# Patient Record
Sex: Female | Born: 1995 | Hispanic: No | Marital: Married | Smoking: Former smoker
Health system: Western US, Academic
[De-identification: ages and names within clinical notes are randomized; demographics above are authoritative.]

## PROBLEM LIST (undated history)

## (undated) DIAGNOSIS — J45909 Unspecified asthma, uncomplicated: Secondary | ICD-10-CM

## (undated) DIAGNOSIS — N879 Dysplasia of cervix uteri, unspecified: Secondary | ICD-10-CM

## (undated) HISTORY — PX: LIVER SURGERY: SHX698

## (undated) HISTORY — DX: Dysplasia of cervix uteri, unspecified: N87.9

## (undated) HISTORY — DX: Unspecified asthma, uncomplicated: J45.909

---

## 2015-08-09 ENCOUNTER — Emergency Department (HOSPITAL_COMMUNITY): Payer: No Typology Code available for payment source

## 2015-08-09 ENCOUNTER — Emergency Department (HOSPITAL_COMMUNITY)
Admission: EM | Admit: 2015-08-09 | Discharge: 2015-08-09 | Disposition: A | Discharge status: Home / Self Care | Payer: No Typology Code available for payment source | Attending: Emergency Medicine | Admitting: Emergency Medicine

## 2015-08-09 ENCOUNTER — Encounter (HOSPITAL_COMMUNITY): Payer: Self-pay | Admitting: *Deleted

## 2015-08-09 DIAGNOSIS — Y92410 Unspecified street and highway as the place of occurrence of the external cause: Secondary | ICD-10-CM | POA: Insufficient documentation

## 2015-08-09 DIAGNOSIS — I10 Essential (primary) hypertension: Secondary | ICD-10-CM | POA: Diagnosis not present

## 2015-08-09 DIAGNOSIS — Y9389 Activity, other specified: Secondary | ICD-10-CM | POA: Insufficient documentation

## 2015-08-09 DIAGNOSIS — S60512A Abrasion of left hand, initial encounter: Secondary | ICD-10-CM | POA: Insufficient documentation

## 2015-08-09 DIAGNOSIS — Y999 Unspecified external cause status: Secondary | ICD-10-CM | POA: Insufficient documentation

## 2015-08-09 DIAGNOSIS — S6992XA Unspecified injury of left wrist, hand and finger(s), initial encounter: Secondary | ICD-10-CM | POA: Diagnosis present

## 2015-08-09 MED ORDER — CYCLOBENZAPRINE HCL 10 MG PO TABS: 10.0000 mg | ORAL_TABLET | Freq: Two times a day (BID) | ORAL | Status: DC | PRN

## 2015-08-09 MED ORDER — NAPROXEN 375 MG PO TABS: 375.0000 mg | ORAL_TABLET | Freq: Two times a day (BID) | ORAL | Status: DC
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
## 2015-08-09 NOTE — ED Notes (Signed)
PT DISCHARGED. INSTRUCTIONS AND PRESCRIPTIONS GIVEN. AAOX4. PT IN NO APPARENT DISTRESS. THE OPPORTUNITY TO ASK QUESTIONS WAS PROVIDED. 

## 2016-05-25 ENCOUNTER — Emergency Department (HOSPITAL_COMMUNITY): Payer: PRIVATE HEALTH INSURANCE

## 2016-05-25 ENCOUNTER — Encounter (HOSPITAL_COMMUNITY): Payer: Self-pay | Admitting: *Deleted

## 2016-05-25 ENCOUNTER — Emergency Department (HOSPITAL_COMMUNITY)
Admission: EM | Admit: 2016-05-25 | Discharge: 2016-05-26 | Disposition: A | Discharge status: Home / Self Care | Payer: PRIVATE HEALTH INSURANCE | Attending: Emergency Medicine | Admitting: Emergency Medicine

## 2016-05-25 DIAGNOSIS — R1011 Right upper quadrant pain: Secondary | ICD-10-CM

## 2016-05-25 DIAGNOSIS — K7689 Other specified diseases of liver: Secondary | ICD-10-CM | POA: Diagnosis not present

## 2016-05-25 DIAGNOSIS — F17200 Nicotine dependence, unspecified, uncomplicated: Secondary | ICD-10-CM | POA: Diagnosis not present

## 2016-05-25 DIAGNOSIS — Z79899 Other long term (current) drug therapy: Secondary | ICD-10-CM | POA: Insufficient documentation

## 2016-05-25 DIAGNOSIS — R109 Unspecified abdominal pain: Secondary | ICD-10-CM | POA: Diagnosis present

## 2016-05-25 LAB — URINALYSIS, ROUTINE W REFLEX MICROSCOPIC
BILIRUBIN URINE: NEGATIVE
Glucose, UA: NEGATIVE mg/dL
HGB URINE DIPSTICK: NEGATIVE
Ketones, ur: NEGATIVE mg/dL
Leukocytes, UA: NEGATIVE
Nitrite: NEGATIVE
PH: 5 (ref 5.0–8.0)
Protein, ur: NEGATIVE mg/dL
SPECIFIC GRAVITY, URINE: 1.025 (ref 1.005–1.030)

## 2016-05-25 LAB — POC URINE PREG, ED: Preg Test, Ur: NEGATIVE

## 2016-05-25 LAB — COMPREHENSIVE METABOLIC PANEL
ALBUMIN: 3.6 g/dL (ref 3.5–5.0)
ALT: 20 U/L (ref 14–54)
ANION GAP: 6 (ref 5–15)
AST: 24 U/L (ref 15–41)
Alkaline Phosphatase: 74 U/L (ref 38–126)
BUN: 9 mg/dL (ref 6–20)
CHLORIDE: 106 mmol/L (ref 101–111)
CO2: 25 mmol/L (ref 22–32)
Calcium: 8.8 mg/dL — ABNORMAL LOW (ref 8.9–10.3)
Creatinine, Ser: 0.97 mg/dL (ref 0.44–1.00)
GFR calc non Af Amer: >60 mL/min (ref >60)
GLUCOSE: 79 mg/dL (ref 65–99)
POTASSIUM: 3.5 mmol/L (ref 3.5–5.1)
SODIUM: 137 mmol/L (ref 135–145)
Total Bilirubin: 1.4 mg/dL — ABNORMAL HIGH (ref 0.3–1.2)
Total Protein: 7 g/dL (ref 6.5–8.1)

## 2016-05-25 LAB — LIPASE, BLOOD: LIPASE: 25 U/L (ref 11–51)

## 2016-05-25 LAB — CBC
HEMATOCRIT: 41 % (ref 36.0–46.0)
HEMOGLOBIN: 13.8 g/dL (ref 12.0–15.0)
MCH: 30.4 pg (ref 26.0–34.0)
MCHC: 33.7 g/dL (ref 30.0–36.0)
MCV: 90.3 fL (ref 78.0–100.0)
Platelets: 197 10*3/uL (ref 150–400)
RBC: 4.54 MIL/uL (ref 3.87–5.11)
RDW: 13.2 % (ref 11.5–15.5)
WBC: 5.2 10*3/uL (ref 4.0–10.5)

## 2016-05-25 NOTE — ED Triage Notes (Signed)
The pt is c/o rt abd pain since last Thursday no n v or diarrhea  lmp 4-17

## 2016-05-25 NOTE — ED Notes (Signed)
Negative Preg Test...Marland Kitchen

## 2016-05-25 NOTE — ED Provider Notes (Signed)
[facility]-EMERGENCY DEPT Provider Note   CSN: [id number] Arrival date & time: 05/25/16  1816     History   Chief Complaint Chief Complaint  Patient presents with  . Abdominal Pain    HPI Tara Rogers is a 21 y.o. female.  HPI Patient presents with right sided abdomen or lower chest pain. Began 4 days ago. Initially somewhat dull but now more constant and severe. Worse with movements. Worse with breathing. No nausea vomiting or diarrhea. No change with food. Worse with certain positions. No trauma. No fevers or chills. No cough. No shortness of breath or swelling in her legs. No urinary symptoms. History reviewed. No pertinent past medical history.  There are no active problems to display for this patient.   History reviewed. No pertinent surgical history.  OB History    No data available       Home Medications    Prior to Admission medications   Medication Sig Start Date End Date Taking? Authorizing Provider  cyclobenzaprine (FLEXERIL) 10 MG tablet Take 1 tablet (10 mg total) by mouth 2 (two) times daily as needed for muscle spasms. Patient not taking: Reported on 05/25/2016 08/09/15   Arthor CaptainHarris, Abigail, PA-C  naproxen (NAPROSYN) 375 MG tablet Take 1 tablet (375 mg total) by mouth 2 (two) times daily. Patient not taking: Reported on 05/25/2016 08/09/15   Arthor CaptainHarris, Abigail, PA-C    Family History No family history on file.  Social History Social History  Substance Use Topics  . Smoking status: Current Every Day Smoker  . Smokeless tobacco: Never Used  . Alcohol use Yes     Comment: occassionally     Allergies   Patient has no known allergies.   Review of Systems Review of Systems  Constitutional: Negative for appetite change.  HENT: Negative for congestion.   Respiratory: Negative for chest tightness.   Cardiovascular: Positive for chest pain.  Gastrointestinal: Positive for abdominal pain. Negative for constipation, diarrhea and vomiting.  Genitourinary: Negative  for flank pain and menstrual problem.  Musculoskeletal: Negative for back pain.  Neurological: Negative for numbness.  Hematological: Negative for adenopathy.  Psychiatric/Behavioral: Negative for confusion.     Physical Exam Updated Vital Signs BP 128/77   Pulse 67   Temp 97.3 F (36.3 C) (Oral)   Resp 16   Ht 5\' 3"  (1.6 m)   Wt 194 lb 12.8 oz (88.4 kg)   LMP 05/18/2016   SpO2 100%   BMI 34.51 kg/m   Physical Exam  Constitutional: She appears well-developed.  HENT:  Head: Atraumatic.  Eyes: Pupils are equal, round, and reactive to light.  Neck: Neck supple.  Cardiovascular: Normal rate.   Pulmonary/Chest: She has no wheezes. She has no rales. She exhibits no tenderness.  Abdominal: There is tenderness.  Right-sided abdominal tenderness. Worse right upper quadrant. No masses.  Musculoskeletal: She exhibits no edema.  Neurological: She is alert.  Skin: Skin is warm. Capillary refill takes less than 2 seconds.  Psychiatric: She has a normal mood and affect.     ED Treatments / Results  Labs (all labs ordered are listed, but only abnormal results are displayed) Labs Reviewed  COMPREHENSIVE METABOLIC PANEL - Abnormal; Notable for the following:       Result Value   Calcium 8.8 (*)    Total Bilirubin 1.4 (*)    All other components within normal limits  LIPASE, BLOOD  CBC  URINALYSIS, ROUTINE W REFLEX MICROSCOPIC  POC URINE PREG, ED    EKG  EKG Interpretation None       Radiology Dg Chest 2 View  Result Date: 05/25/2016 CLINICAL DATA:  Chest pain. EXAM: CHEST  2 VIEW COMPARISON:  August 09, 2015 FINDINGS: The heart size and mediastinal contours are within normal limits. Both lungs are clear. The visualized skeletal structures are unremarkable. IMPRESSION: No active cardiopulmonary disease. Electronically Signed   By: Gerome Sam III M.D   On: 05/25/2016 19:50   US Abdomen Limited  Result Date: 05/25/2016 CLINICAL DATA:  Right upper quadrant pain since  Thursday EXAM: US ABDOMEN LIMITED - RIGHT UPPER QUADRANT COMPARISON:  None. FINDINGS: Gallbladder: No gallstones or wall thickening visualized. No sonographic Murphy sign noted by sonographer. Common bile duct: Diameter: 2.6 mm, normal Liver: No focal lesion identified. Within normal limits in parenchymal echogenicity. A small amount of free fluid is demonstrated around the liver. IMPRESSION: Gallbladder and bile ducts are unremarkable. Small amount of free fluid around the liver is nonspecific. Electronically Signed   By: Burman Nieves M.D.   On: 05/25/2016 22:42    Procedures Procedures (including critical care time)  Medications Ordered in ED Medications - No data to display   Initial Impression / Assessment and Plan / ED Course  I have reviewed the triage vital signs and the nursing notes.  Pertinent labs & imaging results that were available during my care of the patient were reviewed by me and considered in my medical decision making (see chart for details).     Patient with abdominal pain. Right upper quadrant. Had moderate tenderness but lab work reassuring. Ultrasound did not show a cause and had small amount of free fluid that could be physiologic. However patient states she is still in a lot of pain and with initial tenderness will get CT scan. Care was turned over to oncoming doctor.  Final Clinical Impressions(s) / ED Diagnoses   Final diagnoses:  Right upper quadrant abdominal pain    New Prescriptions New Prescriptions   No medications on file     Benjiman Core, MD 05/25/16 2255

## 2016-05-26 ENCOUNTER — Emergency Department (HOSPITAL_COMMUNITY): Payer: PRIVATE HEALTH INSURANCE

## 2016-05-26 LAB — APTT: aPTT: 29 seconds (ref 24–36)

## 2016-05-26 LAB — CBC
HEMATOCRIT: 40.7 % (ref 36.0–46.0)
Hemoglobin: 13.7 g/dL (ref 12.0–15.0)
MCH: 30.3 pg (ref 26.0–34.0)
MCHC: 33.7 g/dL (ref 30.0–36.0)
MCV: 90 fL (ref 78.0–100.0)
PLATELETS: 199 10*3/uL (ref 150–400)
RBC: 4.52 MIL/uL (ref 3.87–5.11)
RDW: 13.2 % (ref 11.5–15.5)
WBC: 7.1 10*3/uL (ref 4.0–10.5)

## 2016-05-26 LAB — TYPE AND SCREEN
ABO/RH(D): B POS
Antibody Screen: NEGATIVE

## 2016-05-26 LAB — PROTIME-INR
INR: 1.1
PROTHROMBIN TIME: 14.3 s (ref 11.4–15.2)

## 2016-05-26 LAB — ABO/RH: ABO/RH(D): B POS

## 2016-05-26 MED ORDER — HYDROCODONE-ACETAMINOPHEN 5-325 MG PO TABS: 1.0000 | ORAL_TABLET | Freq: Four times a day (QID) | ORAL | 0 refills | Status: DC | PRN

## 2016-05-26 MED ORDER — IOPAMIDOL (ISOVUE-300) INJECTION 61%
100.0000 mL | Freq: Once | INTRAVENOUS | Status: AC | PRN
  Administered 2016-05-26: 100 mL via INTRAVENOUS

## 2016-05-26 NOTE — Discharge Instructions (Signed)
°  SEEK IMMEDIATE MEDICAL ATTENTION IF: °The pain does not go away or becomes severe, particularly over the next 8-12 hours.  °A temperature above 100.4F develops.  °Repeated vomiting occurs (multiple episodes).  ° °Blood is being passed in stools or vomit (bright red or black tarry stools).  °Return also if you develop chest pain, difficulty breathing, dizziness or fainting, or become confused, poorly responsive, or inconsolable. ° °

## 2016-05-26 NOTE — ED Provider Notes (Signed)
Vitals stable BP 110/71   Pulse (!) 59   Temp 97.3 F (36.3 C) (Oral)   Resp 16   Ht 5\' 3"  (1.6 m)   Wt 88.4 kg   LMP 05/18/2016   SpO2 100%   BMI 34.51 kg/m  HGB unchanged Pt feels comfortable with d/c home Discussed strict ER return precautions with patient/mother via phone    Zadie RhineWickline, Hoke Baer, MD 05/26/16 [phone]
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
## 2016-05-27 ENCOUNTER — Emergency Department (HOSPITAL_COMMUNITY)
Admission: EM | Admit: 2016-05-27 | Discharge: 2016-05-28 | Disposition: A | Discharge status: Home / Self Care | Payer: PRIVATE HEALTH INSURANCE | Attending: Emergency Medicine | Admitting: Emergency Medicine

## 2016-05-27 ENCOUNTER — Encounter (HOSPITAL_COMMUNITY): Payer: Self-pay

## 2016-05-27 DIAGNOSIS — Y999 Unspecified external cause status: Secondary | ICD-10-CM | POA: Insufficient documentation

## 2016-05-27 DIAGNOSIS — X58XXXA Exposure to other specified factors, initial encounter: Secondary | ICD-10-CM | POA: Insufficient documentation

## 2016-05-27 DIAGNOSIS — S36112A Contusion of liver, initial encounter: Secondary | ICD-10-CM

## 2016-05-27 DIAGNOSIS — S36119A Unspecified injury of liver, initial encounter: Secondary | ICD-10-CM | POA: Diagnosis present

## 2016-05-27 DIAGNOSIS — J45909 Unspecified asthma, uncomplicated: Secondary | ICD-10-CM | POA: Insufficient documentation

## 2016-05-27 DIAGNOSIS — Y929 Unspecified place or not applicable: Secondary | ICD-10-CM | POA: Diagnosis not present

## 2016-05-27 DIAGNOSIS — F17200 Nicotine dependence, unspecified, uncomplicated: Secondary | ICD-10-CM | POA: Diagnosis not present

## 2016-05-27 DIAGNOSIS — Y939 Activity, unspecified: Secondary | ICD-10-CM | POA: Insufficient documentation

## 2016-05-27 HISTORY — DX: Unspecified asthma, uncomplicated: J45.909

## 2016-05-27 LAB — URINALYSIS, ROUTINE W REFLEX MICROSCOPIC
BILIRUBIN URINE: NEGATIVE
Glucose, UA: NEGATIVE mg/dL
HGB URINE DIPSTICK: NEGATIVE
KETONES UR: NEGATIVE mg/dL
Leukocytes, UA: NEGATIVE
NITRITE: NEGATIVE
PROTEIN: NEGATIVE mg/dL
Specific Gravity, Urine: 1.023 (ref 1.005–1.030)
pH: 6 (ref 5.0–8.0)

## 2016-05-27 LAB — PREGNANCY, URINE: PREG TEST UR: NEGATIVE

## 2016-05-27 MED ORDER — MORPHINE SULFATE (PF) 4 MG/ML IV SOLN
4.0000 mg | Freq: Once | INTRAVENOUS | Status: AC
  Administered 2016-05-28: 4 mg via INTRAVENOUS
  Filled 2016-05-27: qty 1

## 2016-05-27 MED ORDER — SODIUM CHLORIDE 0.9 % IV BOLUS (SEPSIS)
1000.0000 mL | Freq: Once | INTRAVENOUS | Status: AC
  Administered 2016-05-28: 1000 mL via INTRAVENOUS

## 2016-05-27 MED ORDER — ONDANSETRON HCL 4 MG/2ML IJ SOLN
4.0000 mg | Freq: Once | INTRAMUSCULAR | Status: AC
  Administered 2016-05-28: 4 mg via INTRAVENOUS
  Filled 2016-05-27: qty 2

## 2016-05-27 NOTE — ED Triage Notes (Signed)
Pt reports RUQ abdominal pain onset 4 days ago without n/v/d. She states she was seen here on Sunday night for same complaint and her CT showed bleeding from her liver (denies recent injury or trauma) and she discharged and told to follow up with [facility] but unable to follow up with them due to needing a referral, per [facility]. Pt was told to return to ED if she developed any further pain.

## 2016-05-27 NOTE — ED Provider Notes (Signed)
[facility] Provider Note   CSN: [id number] Arrival date & time: 05/27/16  1944  By signing my name below, I, Linna Darner, attest that this documentation has been prepared under the direction and in the presence of physician practitioner, Tariyah Pendry, Mayer Masker, MD. Electronically Signed: Linna Darner, Scribe. 05/27/2016. 11:08 PM.  History   Chief Complaint Chief Complaint  Patient presents with  . Abdominal Pain   The history is provided by the patient. No language interpreter was used.    HPI Comments: Tara Rogers is a 21 y.o. female who presents to the Emergency Department complaining of sudden onset, constant, unchanged RUQ pain for several days. She currently rates her pain at 8/10. She was evaluated here two days ago for the same and had a CT abdomen/pelvis which revealed what appeared to be a grade 2 laceration across the anterior aspect of the right hepatic lobe. At that time her case was discussed with the general surgeon on call who stated her pain was probably a result of a spontaneous hepatic cyst rupture. She was discharged with hydrocodone which has not provided significant improvement of her pain and was advised to come back to the ED if her pain persisted. Patient notes her abdominal pain is not worse than it was two days ago when she was evaluated here. She works at [employer] and occasionally lifts heavy items but denies any known trauma to her abdomen. She states her pain is worse with respiration, movement, and certain positions. She denies nausea, vomiting, diarrhea, fever, chills, dizziness, lightheadedness, or any other associated symptoms.Patient reports that she had difficulty getting into surgery clinic as they required "a referral."  Past Medical History:  Diagnosis Date  . Asthma    "I grew out of it"    There are no active problems to display for this patient.   History reviewed. No pertinent surgical history.  OB History    No data available        Home Medications    Prior to Admission medications   Medication Sig Start Date End Date Taking? Authorizing Provider  oxyCODONE-acetaminophen (PERCOCET/ROXICET) 5-325 MG tablet Take 1-2 tablets by mouth every 6 (six) hours as needed for severe pain. 05/28/16   Felice Hope, Mayer Masker, MD    Family History No family history on file.  Social History Social History  Substance Use Topics  . Smoking status: Current Every Day Smoker  . Smokeless tobacco: Never Used  . Alcohol use Yes     Comment: occassionally     Allergies   Patient has no known allergies.   Review of Systems Review of Systems  Constitutional: Negative for chills and fever.  Respiratory: Negative for shortness of breath.   Cardiovascular: Negative for chest pain.  Gastrointestinal: Positive for abdominal pain. Negative for nausea and vomiting.  Neurological: Negative for dizziness and light-headedness.  All other systems reviewed and are negative.  Physical Exam Updated Vital Signs BP 121/82   Pulse (!) 54   Temp 98.1 F (36.7 C) (Oral)   Resp 18   LMP 05/18/2016   SpO2 100%   Physical Exam  Constitutional: She is oriented to person, place, and time. She appears well-developed and well-nourished.  HENT:  Head: Normocephalic and atraumatic.  Cardiovascular: Normal rate, regular rhythm and normal heart sounds.   Pulmonary/Chest: Effort normal and breath sounds normal. No respiratory distress. She has no wheezes.  Abdominal: Soft. Bowel sounds are normal. There is tenderness. There is guarding.  Right upper quadrant tenderness to  palpation with voluntary guarding  Neurological: She is alert and oriented to person, place, and time.  Skin: Skin is warm and dry.  Psychiatric: She has a normal mood and affect.  Nursing note and vitals reviewed.  ED Treatments / Results  Labs (all labs ordered are listed, but only abnormal results are displayed) Labs Reviewed  LIPASE, BLOOD  COMPREHENSIVE METABOLIC  PANEL  CBC  URINALYSIS, ROUTINE W REFLEX MICROSCOPIC  PREGNANCY, URINE    EKG  EKG Interpretation None       Radiology No results found.  Procedures Procedures (including critical care time)  DIAGNOSTIC STUDIES: Oxygen Saturation is 100% on RA, normal by my interpretation.    COORDINATION OF CARE: 11:14 PM Discussed treatment plan with pt at bedside and pt agreed to plan.  Medications Ordered in ED Medications  morphine 4 MG/ML injection 4 mg (4 mg Intravenous Given 05/28/16 0017)  ondansetron (ZOFRAN) injection 4 mg (4 mg Intravenous Given 05/28/16 0017)  sodium chloride 0.9 % bolus 1,000 mL (0 mLs Intravenous Stopped 05/28/16 0120)     Initial Impression / Assessment and Plan / ED Course  I have reviewed the triage vital signs and the nursing notes.  Pertinent labs & imaging results that were available during my care of the patient were reviewed by me and considered in my medical decision making (see chart for details).     Patient presents with persistent pain in the right upper quadrant. She has been unable to follow-up in surgery clinic and having difficulty scheduling. She is nontoxic-appearing. She does have some voluntary guarding. No dizziness or signs or symptoms of acute anemia. Patient was given pain medication. Repeat lab work with stable hemoglobin. I discussed the patient with Dr. Derrell Lollingamirez. Given that her hemoglobin is stable, doubt additional imaging would be helpful. She does need follow-up in general surgery clinic. He will make note of this so that she does not have difficulty obtaining follow-up. I will increase her pain regimen to Percocet. This was discussed with the patient and her mother.  After history, exam, and medical workup I feel the patient has been appropriately medically screened and is safe for discharge home. Pertinent diagnoses were discussed with the patient. Patient was given return precautions.   Final Clinical Impressions(s) / ED Diagnoses    Final diagnoses:  Liver hematoma, initial encounter    New Prescriptions New Prescriptions   OXYCODONE-ACETAMINOPHEN (PERCOCET/ROXICET) 5-325 MG TABLET    Take 1-2 tablets by mouth every 6 (six) hours as needed for severe pain.   I personally performed the services described in this documentation, which was scribed in my presence. The recorded information has been reviewed and is accurate.    Shon BatonHorton, Sameul Tagle F, MD 05/28/16 [id number]

## 2016-05-28 LAB — CBC
HCT: 40.9 % (ref 36.0–46.0)
HEMOGLOBIN: 13.8 g/dL (ref 12.0–15.0)
MCH: 30.3 pg (ref 26.0–34.0)
MCHC: 33.7 g/dL (ref 30.0–36.0)
MCV: 89.9 fL (ref 78.0–100.0)
PLATELETS: 204 10*3/uL (ref 150–400)
RBC: 4.55 MIL/uL (ref 3.87–5.11)
RDW: 13.2 % (ref 11.5–15.5)
WBC: 6 10*3/uL (ref 4.0–10.5)

## 2016-05-28 LAB — COMPREHENSIVE METABOLIC PANEL
ALBUMIN: 3.9 g/dL (ref 3.5–5.0)
ALT: 16 U/L (ref 14–54)
ANION GAP: 9 (ref 5–15)
AST: 21 U/L (ref 15–41)
Alkaline Phosphatase: 64 U/L (ref 38–126)
BILIRUBIN TOTAL: 0.9 mg/dL (ref 0.3–1.2)
BUN: 8 mg/dL (ref 6–20)
CALCIUM: 9.2 mg/dL (ref 8.9–10.3)
CO2: 24 mmol/L (ref 22–32)
CREATININE: 0.94 mg/dL (ref 0.44–1.00)
Chloride: 104 mmol/L (ref 101–111)
GLUCOSE: 86 mg/dL (ref 65–99)
Potassium: 3.5 mmol/L (ref 3.5–5.1)
Sodium: 137 mmol/L (ref 135–145)
TOTAL PROTEIN: 7 g/dL (ref 6.5–8.1)

## 2016-05-28 LAB — LIPASE, BLOOD: Lipase: 21 U/L (ref 11–51)

## 2016-05-28 MED ORDER — OXYCODONE-ACETAMINOPHEN 5-325 MG PO TABS: 1.0000 | ORAL_TABLET | Freq: Four times a day (QID) | ORAL | 0 refills | Status: AC | PRN

## 2016-05-28 NOTE — ED Notes (Signed)
Pt given crackers, sprite, and ice per request.

## 2016-05-28 NOTE — ED Notes (Signed)
Pt departed in NAD, refused use of wheelchair.  

## 2016-05-28 NOTE — ED Notes (Signed)
Attempted to gain IV access twice, without success.  

## 2016-05-28 NOTE — Discharge Instructions (Signed)
You were seen today for persistent pain. Your hemoglobin is stable. No indication for repeat imaging at this time. Your pain medication will be changed to oxycodone. Do not combine oxycodone and hydrocodone together. Follow-up with general surgery. If you have any new or worsening symptoms she should be reevaluated.

## 2016-06-09 ENCOUNTER — Other Ambulatory Visit: Payer: Self-pay | Admitting: General Surgery

## 2016-06-09 DIAGNOSIS — S36115S Moderate laceration of liver, sequela: Secondary | ICD-10-CM

## 2016-06-15 ENCOUNTER — Ambulatory Visit
Admission: RE | Admit: 2016-06-15 | Discharge: 2016-06-15 | Disposition: A | Discharge status: Home / Self Care | Payer: PRIVATE HEALTH INSURANCE | Source: Ambulatory Visit | Attending: General Surgery | Admitting: General Surgery

## 2016-06-15 DIAGNOSIS — S36115S Moderate laceration of liver, sequela: Secondary | ICD-10-CM

## 2016-06-15 MED ORDER — GADOBENATE DIMEGLUMINE 529 MG/ML IV SOLN
19.0000 mL | Freq: Once | INTRAVENOUS | Status: AC | PRN
  Administered 2016-06-15: 19 mL via INTRAVENOUS

## 2016-09-16 ENCOUNTER — Other Ambulatory Visit: Payer: Self-pay | Admitting: General Surgery

## 2016-09-16 DIAGNOSIS — S3991XA Unspecified injury of abdomen, initial encounter: Secondary | ICD-10-CM

## 2016-11-21 ENCOUNTER — Ambulatory Visit
Admission: RE | Admit: 2016-11-21 | Discharge: 2016-11-21 | Disposition: A | Discharge status: Home / Self Care | Payer: BLUE CROSS/BLUE SHIELD | Source: Ambulatory Visit | Attending: General Surgery | Admitting: General Surgery

## 2016-11-21 DIAGNOSIS — S3991XA Unspecified injury of abdomen, initial encounter: Secondary | ICD-10-CM

## 2016-11-21 MED ORDER — GADOBENATE DIMEGLUMINE 529 MG/ML IV SOLN
17.0000 mL | Freq: Once | INTRAVENOUS | Status: AC | PRN
  Administered 2016-11-21: 17 mL via INTRAVENOUS

## 2016-11-21 NOTE — Progress Notes (Signed)
Please let patient know MRI looks normal and laceration has completely healed.

## 2017-09-21 ENCOUNTER — Ambulatory Visit (HOSPITAL_COMMUNITY)
Admission: EM | Admit: 2017-09-21 | Discharge: 2017-09-21 | Disposition: A | Discharge status: Home / Self Care | Payer: BLUE CROSS/BLUE SHIELD | Attending: Family Medicine | Admitting: Family Medicine

## 2017-09-21 ENCOUNTER — Encounter (HOSPITAL_COMMUNITY): Payer: Self-pay

## 2017-09-21 DIAGNOSIS — H66002 Acute suppurative otitis media without spontaneous rupture of ear drum, left ear: Secondary | ICD-10-CM | POA: Insufficient documentation

## 2017-09-21 DIAGNOSIS — H9203 Otalgia, bilateral: Secondary | ICD-10-CM | POA: Diagnosis present

## 2017-09-21 DIAGNOSIS — F17200 Nicotine dependence, unspecified, uncomplicated: Secondary | ICD-10-CM | POA: Insufficient documentation

## 2017-09-21 MED ORDER — CEFDINIR 300 MG PO CAPS: 300.0000 mg | ORAL_CAPSULE | Freq: Two times a day (BID) | ORAL | 0 refills | Status: AC

## 2017-09-21 NOTE — ED Triage Notes (Signed)
Pt presents with ongoing pain in both ears; has a history of ear infections.

## 2017-09-21 NOTE — Discharge Instructions (Signed)
It was nice meeting you!!  We will go ahead and treat you for an ear infection in the left ear. Sure you finish the entire course of antibiotics. He can take Tylenol or ibuprofen for pain Follow up as needed for continued or worsening symptoms

## 2017-09-21 NOTE — ED Provider Notes (Signed)
[facility]    CSN: [id number] Arrival date & time: 09/21/17  1547     History   Chief Complaint Chief Complaint  Patient presents with  . Otalgia    Both Ears    HPI Tara Rogers is a 22 y.o. female.   Patient is a 22 year old female that presents with bilateral ear pain more on the left side.  This is been going on since Thursday.  Symptoms have remained constant.  She has not taken anything for symptoms.  She does have a history of ear infections.  She denies any fever, chills, body aches dizziness, headaches.  She denies any URI symptoms.   Pt also complaining  of vaginal ph being off and vaginal odor. She is requesting to be checked for infection and STDs.  She is currently sexually active with same sex partner.  She denies any vaginal discharge, itching, irritation, bleeding.  She denies any dysuria, hematuria, abdominal pain,  pelvic pain.   She is a current everyday smoker.   ROS per HPI      Past Medical History:  Diagnosis Date  . Asthma    "I grew out of it"    There are no active problems to display for this patient.   History reviewed. No pertinent surgical history.  OB History   None      Home Medications    Prior to Admission medications   Medication Sig Start Date End Date Taking? Authorizing Provider  cefdinir (OMNICEF) 300 MG capsule Take 1 capsule (300 mg total) by mouth 2 (two) times daily for 7 days. 09/21/17 09/28/17  Dahlia Byes A, NP  oxyCODONE-acetaminophen (PERCOCET/ROXICET) 5-325 MG tablet Take 1-2 tablets by mouth every 6 (six) hours as needed for severe pain. 05/28/16   Horton, Mayer Masker, MD    Family History History reviewed. No pertinent family history.  Social History Social History   Tobacco Use  . Smoking status: Current Every Day Smoker  . Smokeless tobacco: Never Used  Substance Use Topics  . Alcohol use: Yes    Comment: occassionally  . Drug use: No     Allergies   Patient has no known  allergies.   Review of Systems Review of Systems   Physical Exam Triage Vital Signs ED Triage Vitals  Enc Vitals Group     BP 09/21/17 1623 116/72     Pulse Rate 09/21/17 1623 84     Resp 09/21/17 1623 20     Temp 09/21/17 1623 98.6 F (37 C)     Temp Source 09/21/17 1623 Oral     SpO2 09/21/17 1623 98 %     Weight --      Height --      Head Circumference --      Peak Flow --      Pain Score 09/21/17 1620 7     Pain Loc --      Pain Edu? --      Excl. in GC? --    No data found.  Updated Vital Signs BP 116/72 (BP Location: Right Arm)   Pulse 84   Temp 98.6 F (37 C) (Oral)   Resp 20   LMP 09/08/2017   SpO2 98%   Visual Acuity Right Eye Distance:   Left Eye Distance:   Bilateral Distance:    Right Eye Near:   Left Eye Near:    Bilateral Near:     Physical Exam  Constitutional: She is oriented to person, place,  and time. She appears well-developed and well-nourished.  Very pleasant.  Nontoxic or ill-appearing  HENT:  Head: Normocephalic and atraumatic.  Nose: Nose normal.  Mouth/Throat: Oropharynx is clear and moist.  Left TM cloudy with no light reflex and loss of landmarks.  Right TM normal.  No drainage.   Eyes: Conjunctivae are normal.  Neck: Normal range of motion.  Pulmonary/Chest: Effort normal.  Musculoskeletal: Normal range of motion.  Neurological: She is alert and oriented to person, place, and time.  Skin: Skin is warm and dry.  Psychiatric: She has a normal mood and affect.  Nursing note and vitals reviewed.    UC Treatments / Results  Labs (all labs ordered are listed, but only abnormal results are displayed) Labs Reviewed  CERVICOVAGINAL ANCILLARY ONLY    EKG None  Radiology No results found.  Procedures Procedures (including critical care time)  Medications Ordered in UC Medications - No data to display  Initial Impression / Assessment and Plan / UC Course  I have reviewed the triage vital signs and the nursing  notes.  Pertinent labs & imaging results that were available during my care of the patient were reviewed by me and considered in my medical decision making (see chart for details).     Left otitis media. Will treat with Cefdinir.   Told to follow up if no improvement in the next few days.  Pt understanding and agreed. Pt also wanting to be checked for yeast, BV and STDs. Self swab obtained. Will call with any positive results.   Final Clinical Impressions(s) / UC Diagnoses   Final diagnoses:  Non-recurrent acute suppurative otitis media of left ear without spontaneous rupture of tympanic membrane     Discharge Instructions     It was nice meeting you!!  We will go ahead and treat you for an ear infection in the left ear. Sure you finish the entire course of antibiotics. He can take Tylenol or ibuprofen for pain Follow up as needed for continued or worsening symptoms     ED Prescriptions    Medication Sig Dispense Auth. Provider   cefdinir (OMNICEF) 300 MG capsule Take 1 capsule (300 mg total) by mouth 2 (two) times daily for 7 days. 14 capsule Dahlia Byes A, NP     Controlled Substance Prescriptions [place] Controlled Substance Registry consulted? Not Applicable   Janace Aris, NP 09/21/17 1724

## 2017-09-22 ENCOUNTER — Telehealth (HOSPITAL_COMMUNITY): Payer: Self-pay

## 2017-09-22 LAB — CERVICOVAGINAL ANCILLARY ONLY
BACTERIAL VAGINITIS: POSITIVE — AB
Candida vaginitis: NEGATIVE
Chlamydia: NEGATIVE
NEISSERIA GONORRHEA: NEGATIVE
TRICH (WINDOWPATH): NEGATIVE

## 2017-09-22 MED ORDER — METRONIDAZOLE 500 MG PO TABS: 500.0000 mg | ORAL_TABLET | Freq: Two times a day (BID) | ORAL | 0 refills | Status: DC

## 2017-09-22 NOTE — Telephone Encounter (Signed)
Bacterial vaginosis is positive. This was not treated at the urgent care visit.  Patient complains of persistent symptoms.  Flagyl 500 mg BID x 7 days #14 no refills sent to patients pharmacy of choice per Dr. Murray.  Pt called and made aware of results and new prescription. Answered all questions and pt verbalized understanding.  

## 2018-07-05 IMAGING — DX DG CHEST 2V
2 series · 2 of 2 positions shown · non-contrast
Comparison: August 09, 2015

CLINICAL DATA: Chest pain.

EXAM:
CHEST  2 VIEW

[chest pa]
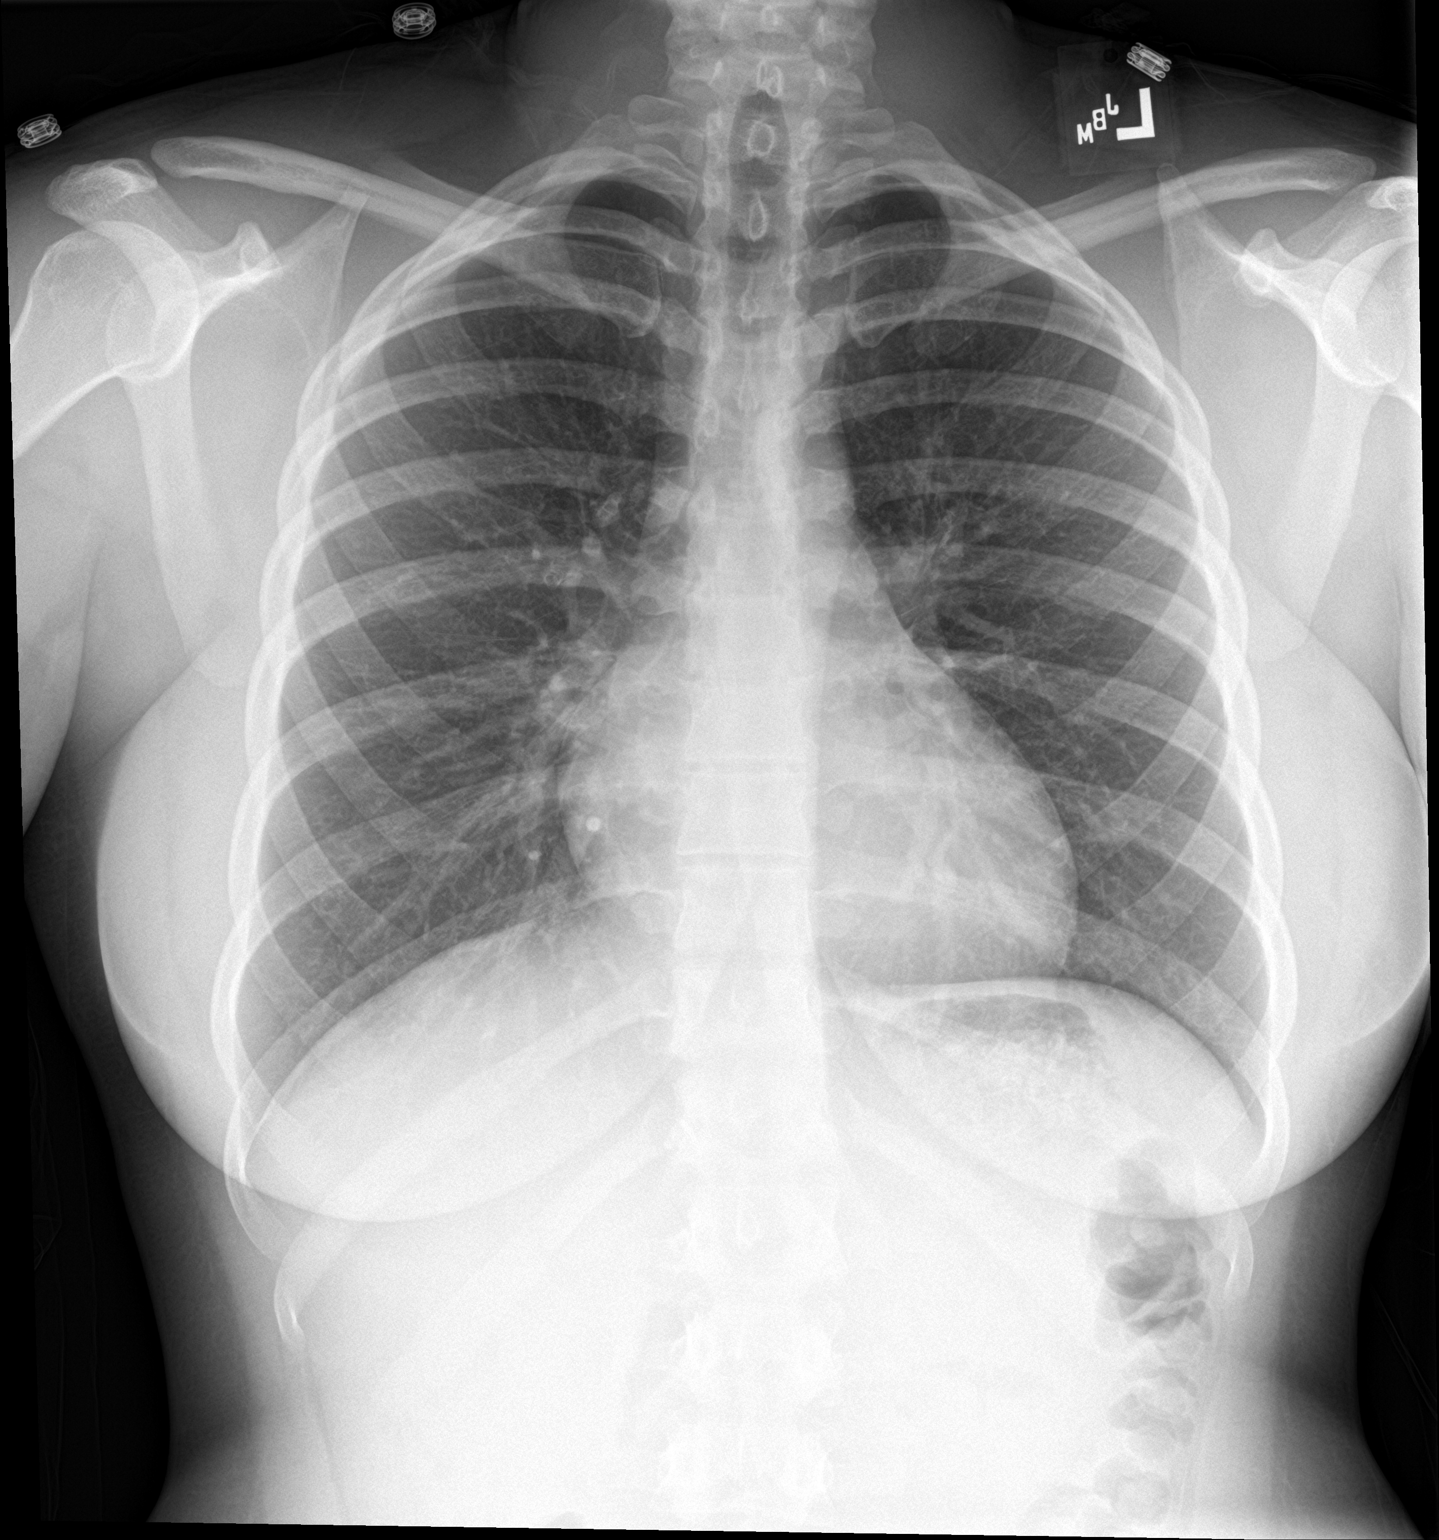

[chest lat]
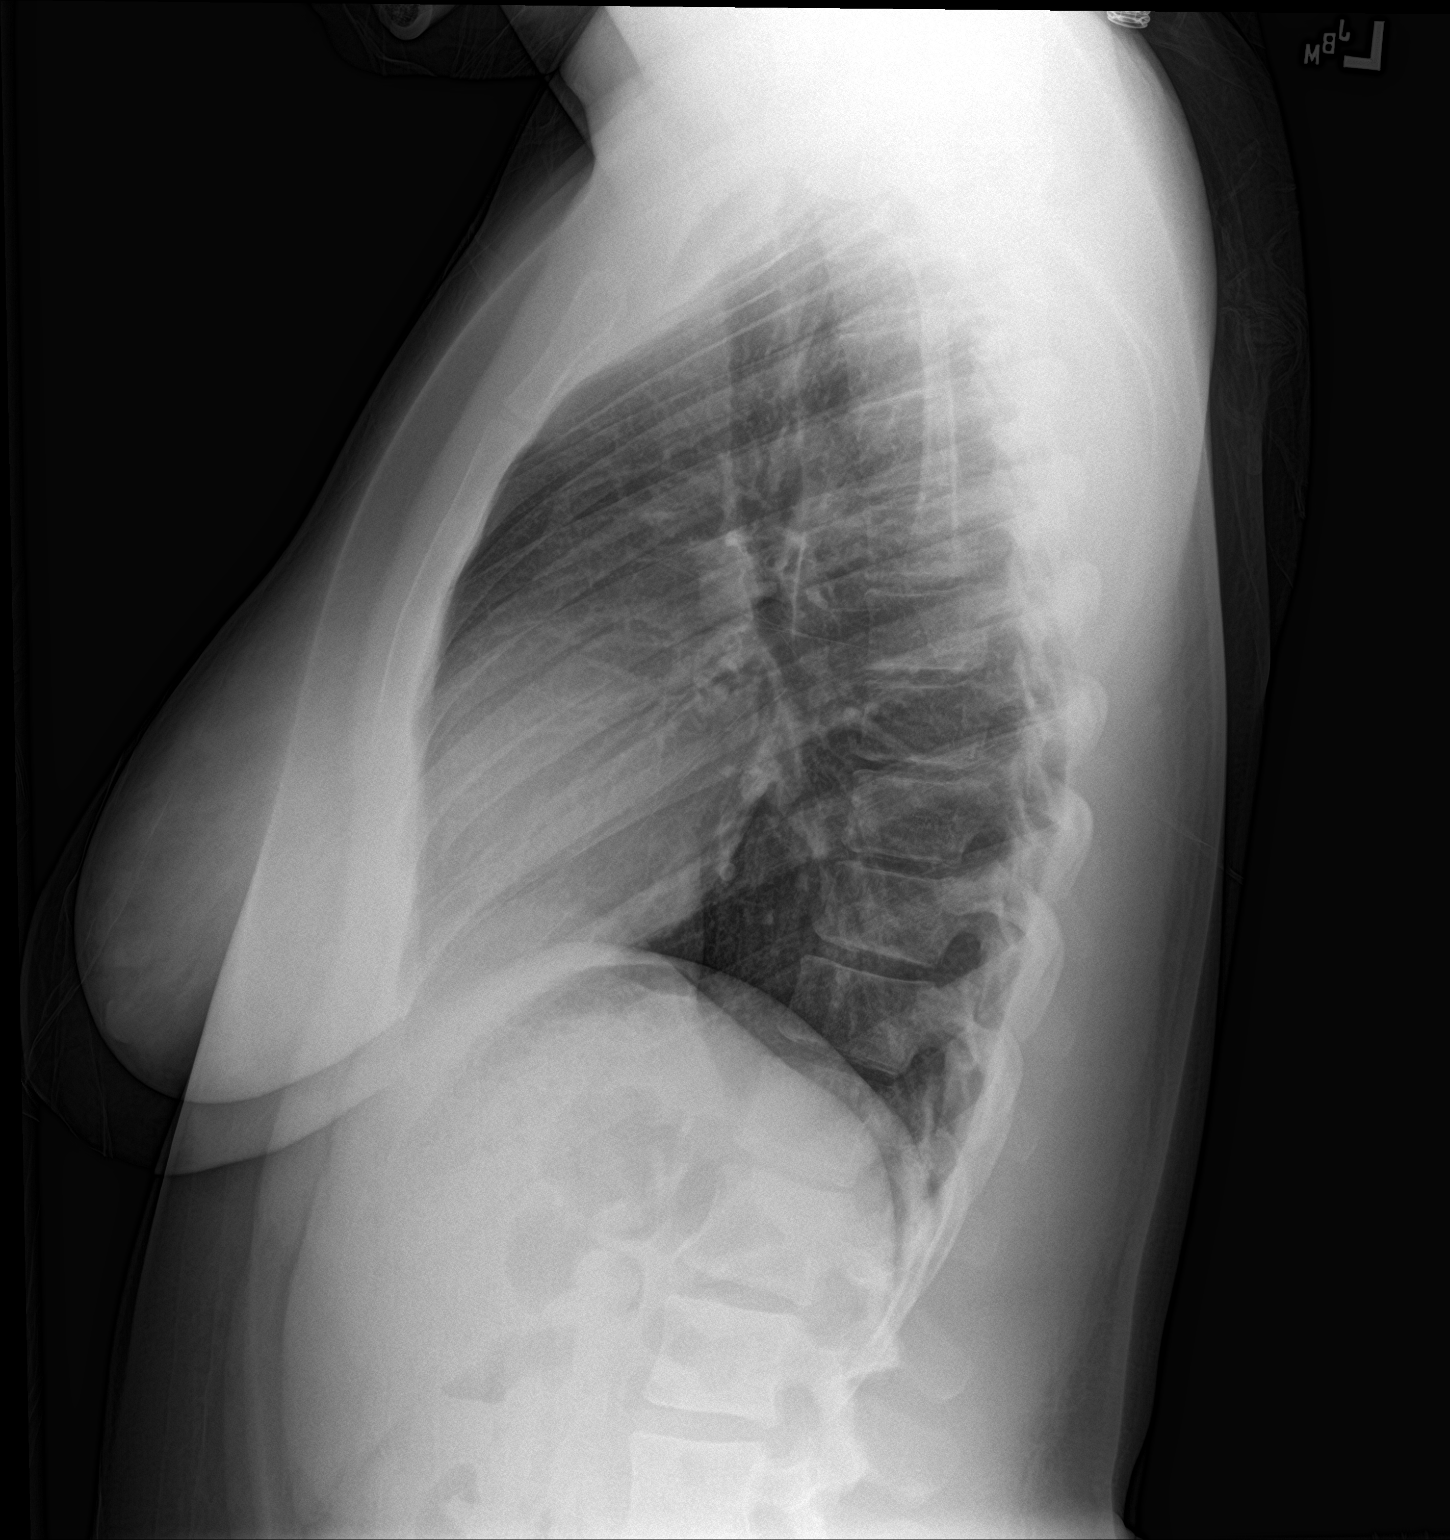

[2 of 2 positions shown; findings below may reference images not displayed]

FINDINGS: The heart size and mediastinal contours are within normal limits.
Both lungs are clear. The visualized skeletal structures are
unremarkable.
IMPRESSION: No active cardiopulmonary disease.

## 2018-07-31 IMAGING — US US ABDOMEN LIMITED
1 series · 14 of 25 positions shown · non-contrast
Comparison: None.

CLINICAL DATA: Right upper quadrant pain since [REDACTED]

EXAM:
US ABDOMEN LIMITED - RIGHT UPPER QUADRANT

[Series 1: us abdomen limited · 0.20mm/px · 14 of 42 slices shown]
[im 1/42]
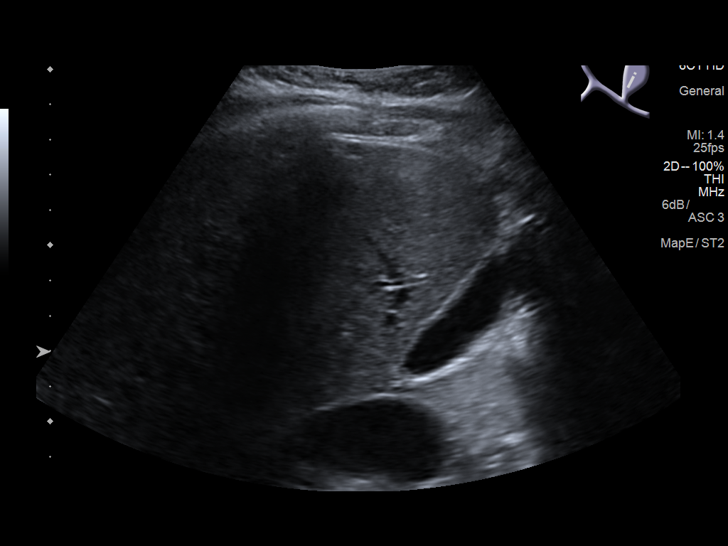
[im 4/42]
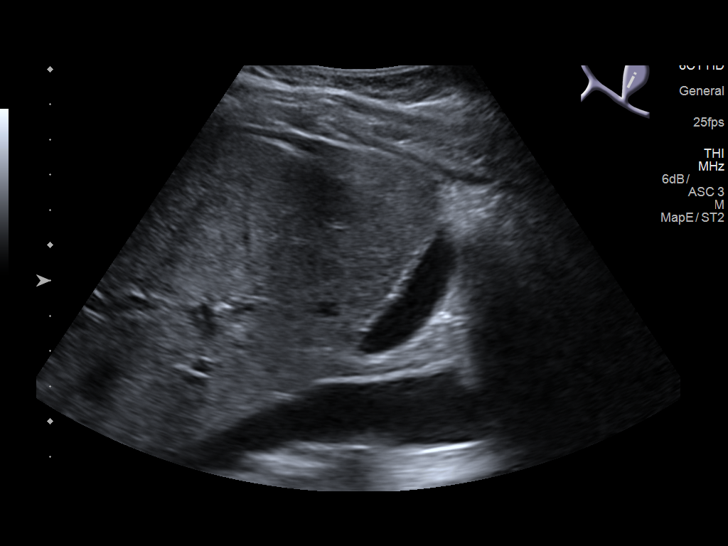
[im 7/42]
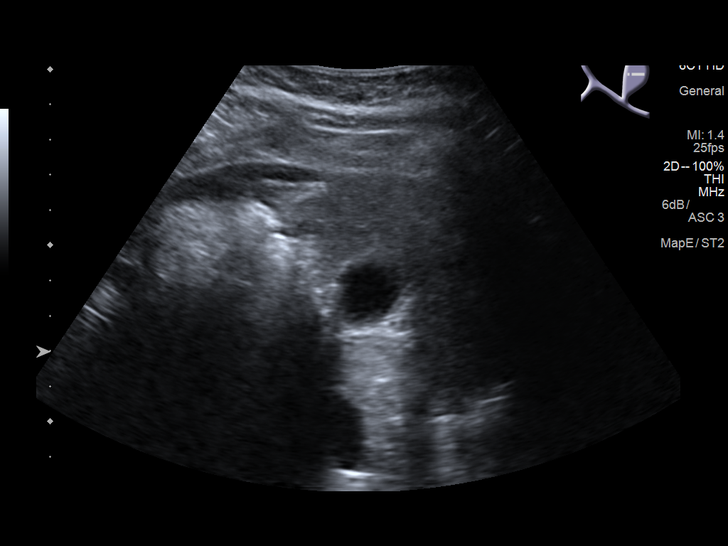
[im 11/42]
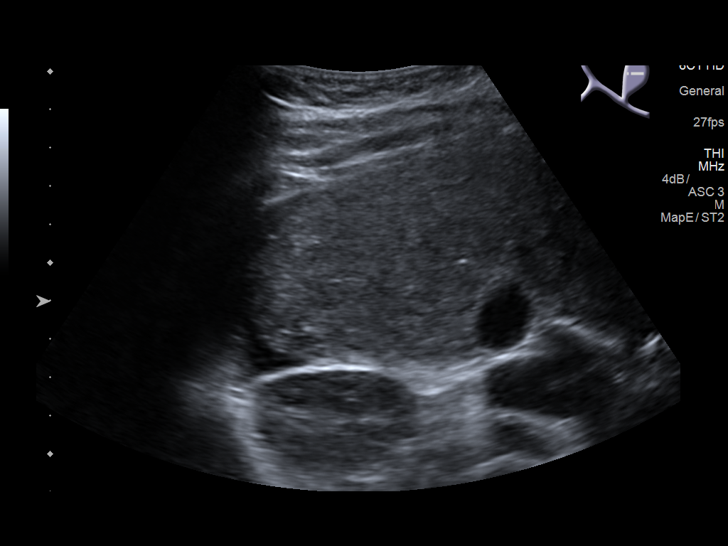
[im 14/42]
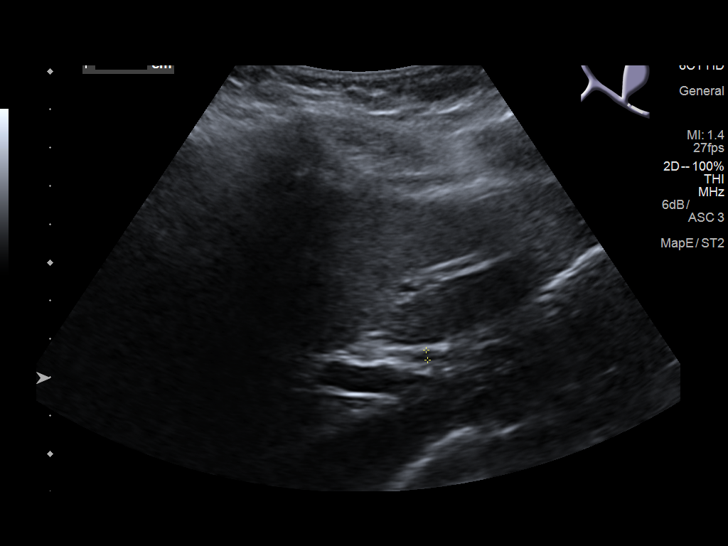
[im 16/42]
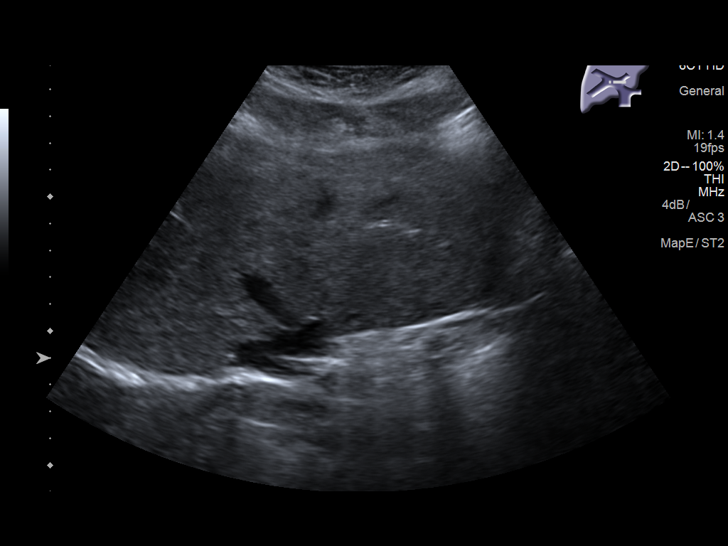
[im 19/42]
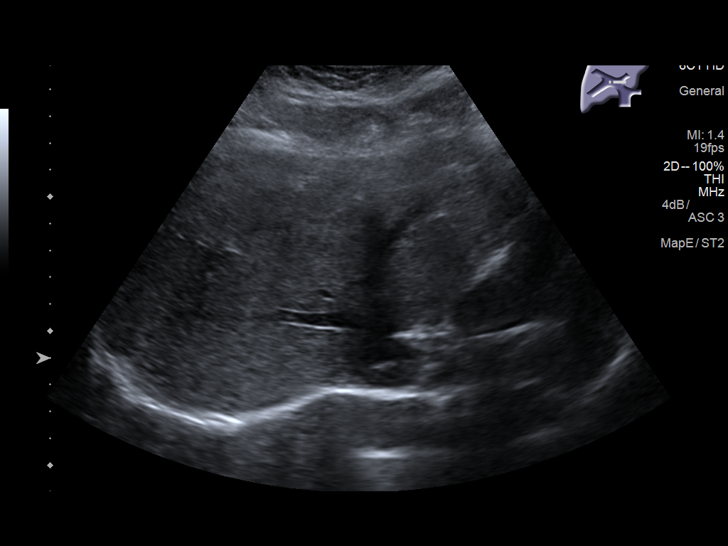
[im 23/42]
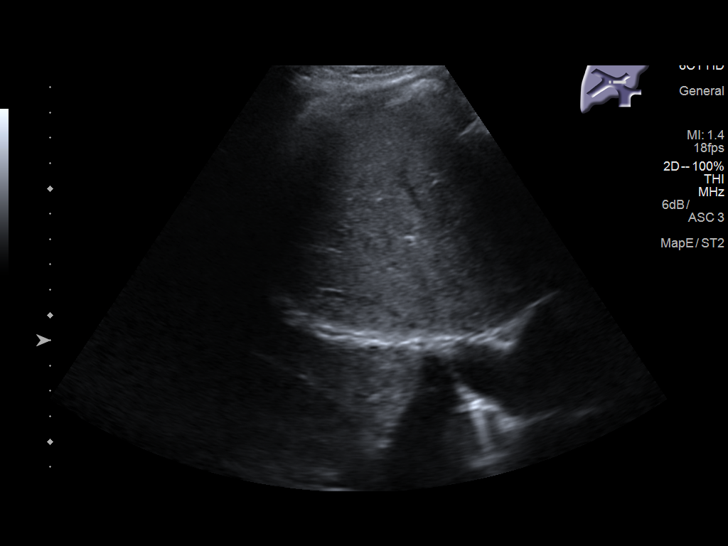
[im 26/42]
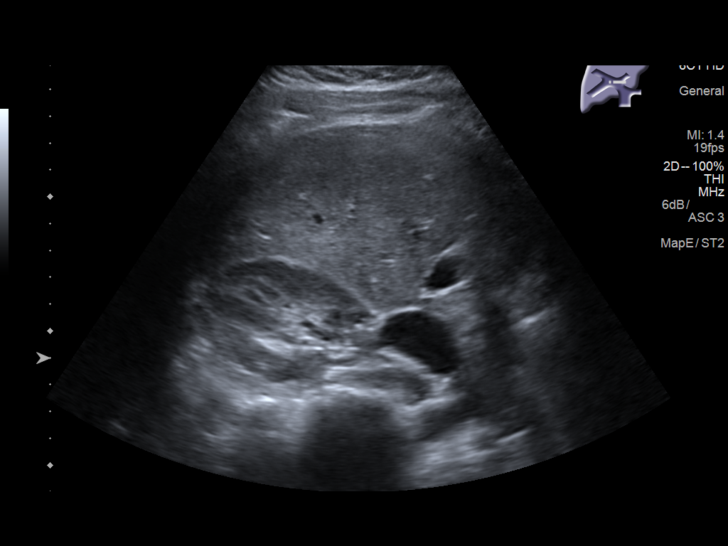
[im 28/42]
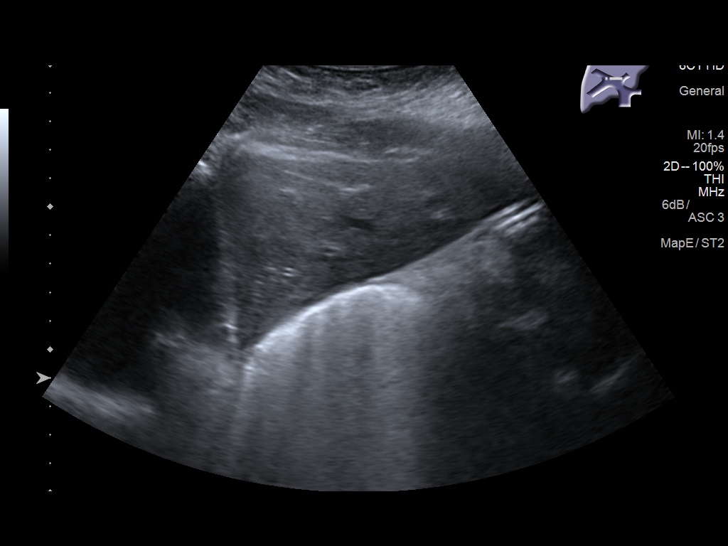
[im 31/42]
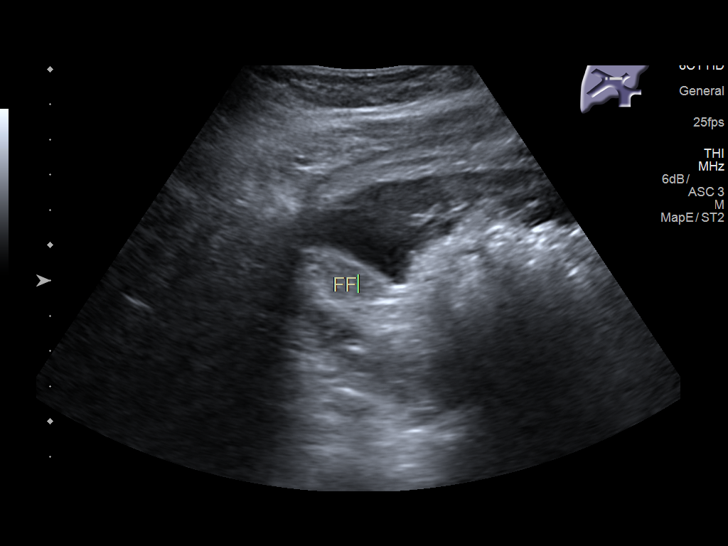
[im 35/42]
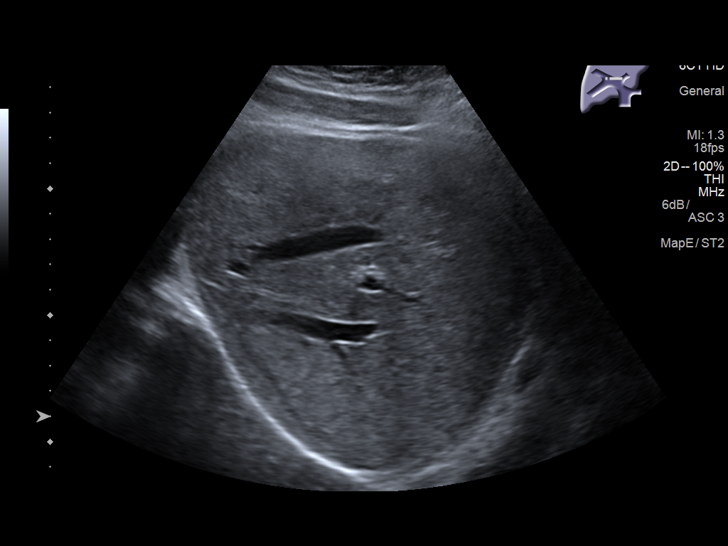
[im 38/42]
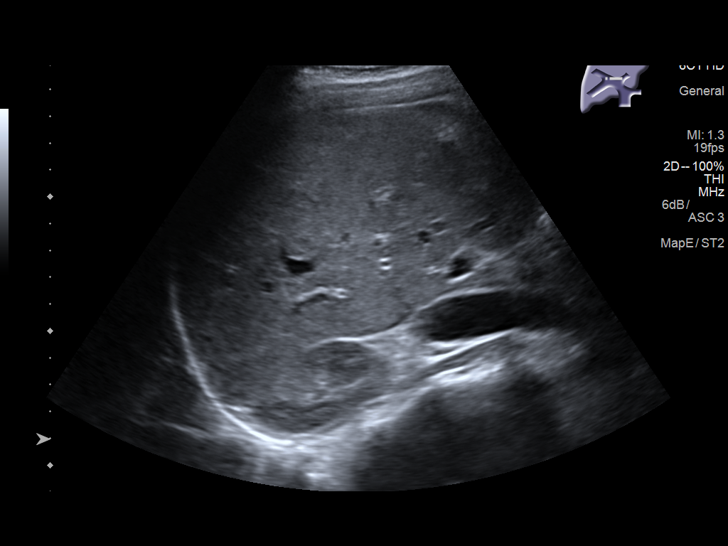
[im 42/42]
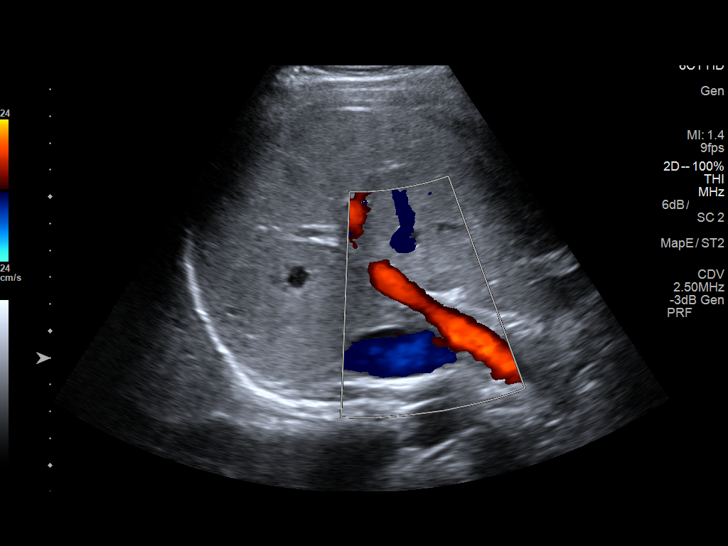

[14 of 25 positions shown; findings below may reference images not displayed]

FINDINGS: Gallbladder:

No gallstones or wall thickening visualized. No sonographic Murphy
sign noted by sonographer.

Common bile duct:

Diameter: 2.6 mm, normal

Liver:

No focal lesion identified. Within normal limits in parenchymal
echogenicity. A small amount of free fluid is demonstrated around
the liver.
IMPRESSION: Gallbladder and bile ducts are unremarkable. Small amount of free
fluid around the liver is nonspecific.

## 2018-11-08 ENCOUNTER — Other Ambulatory Visit: Payer: Self-pay

## 2018-11-08 ENCOUNTER — Emergency Department (HOSPITAL_COMMUNITY)
Admission: EM | Admit: 2018-11-08 | Discharge: 2018-11-08 | Disposition: A | Discharge status: Home / Self Care | Payer: BC Managed Care – PPO | Attending: Emergency Medicine | Admitting: Emergency Medicine

## 2018-11-08 DIAGNOSIS — F17210 Nicotine dependence, cigarettes, uncomplicated: Secondary | ICD-10-CM | POA: Insufficient documentation

## 2018-11-08 DIAGNOSIS — J45909 Unspecified asthma, uncomplicated: Secondary | ICD-10-CM | POA: Diagnosis not present

## 2018-11-08 DIAGNOSIS — R1011 Right upper quadrant pain: Secondary | ICD-10-CM | POA: Diagnosis present

## 2018-11-08 LAB — I-STAT BETA HCG BLOOD, ED (MC, WL, AP ONLY): I-stat hCG, quantitative: <5 m[IU]/mL (ref <5)

## 2018-11-08 LAB — I-STAT CHEM 8, ED
BUN: 12 mg/dL (ref 6–20)
Calcium, Ion: 1.2 mmol/L (ref 1.15–1.40)
Chloride: 104 mmol/L (ref 98–111)
Creatinine, Ser: 0.9 mg/dL (ref 0.44–1.00)
Glucose, Bld: 73 mg/dL (ref 70–99)
HCT: 44 % (ref 36.0–46.0)
Hemoglobin: 15 g/dL (ref 12.0–15.0)
Potassium: 4 mmol/L (ref 3.5–5.1)
Sodium: 141 mmol/L (ref 135–145)
TCO2: 27 mmol/L (ref 22–32)

## 2018-11-08 MED ORDER — MORPHINE SULFATE (PF) 4 MG/ML IV SOLN
4.0000 mg | Freq: Once | INTRAVENOUS | Status: AC
  Administered 2018-11-08: 4 mg via INTRAVENOUS
  Filled 2018-11-08: qty 1
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
## 2018-11-08 NOTE — Discharge Instructions (Signed)
Take medication previously prescribed to you for pain.  Return for any new or worsening symptoms.
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
## 2019-01-21 HISTORY — PX: COLPOSCOPY: SHX161

## 2021-05-09 ENCOUNTER — Encounter (INDEPENDENT_AMBULATORY_CARE_PROVIDER_SITE_OTHER): Payer: Self-pay

## 2021-05-23 DIAGNOSIS — Z87410 Personal history of cervical dysplasia: Secondary | ICD-10-CM | POA: Insufficient documentation

## 2021-05-23 NOTE — Progress Notes (Signed)
New Gynecology Visit:     GYN consult      HPI:  Sonya Davenport is a 26 year old G0P0000 here to discuss colposcopy with pain management/anesthesia. She is new to my practice and new to [facility]. She is here with her wife Raven today.    She reports a history of pain with pelvic exams. She tried to do a colposcopy last month at an outside clinic and could not tolerate the speculum exam and the procedure was aborted.     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   No excisional procedure.   No pap screening 2022.  Most recent pap outside [facility] 04/2021 ASCUS, non-typed HPV +.   She did receive the gardasil vaccine series.    Previous smoker. Now vapes but no tobacco use.    Reports heavy menses. She bleeds onto her sheets sometimes. Painful menses. Regular cycles once per month. She bleeds for 7-8 days. Passes large clots. Family history of fibroids. She has tried birth control pills. The birth control pills caused extended bleeding. She uses ibuprofen for pain as well. She has not tried any other therapies.     No history of STIs besides HPV.     Patient's last menstrual period was 05/15/2021.    OB History   Gravida Para Term Preterm AB Living   0 0 0 0 0 0   SAB IAB Ectopic Multiple Live Births   0 0 0 0 0       Review of Systems  Constitutional: Unusual fatigue  Eyes: Negative  ENT: Negative  Cardiac: Negative  Pulmonary: Negative  Gastrointestional: Negative  Musculoskeletal: Lower back pain  Skin: Negative  Neurologic: Negative  Psychiatric: Difficulty Sleeping  Endocrine: Negative  Blood Disease: Negative  Allergy: Negative  OB/Gyn: Pelvic pain;Abnormal vaginal bleeding    Past Medical History:   Diagnosis Date   . Asthma    . Cervical dysplasia      Past Surgical History:   Procedure Laterality Date   . COLPOSCOPY  2021   . LIVER SURGERY      after abdominal trauma     Social History     Socioeconomic History   . Marital status: Married   Tobacco Use   . Smoking status: Former     Types: Cigarettes   . Smokeless tobacco:  Never   . Tobacco comments:     Currently vaping    Substance and Sexual Activity   . Alcohol use: Yes     Comment: 2 drinks per week   . Drug use: Never   . Sexual activity: Yes     Partners: Female     Comment: Raven, wife       Current Outpatient Medications:   .  buPROPion (WELLBUTRIN) 300 MG Extended-Release tablet, , Disp: , Rfl:   .  FLUoxetine (PROZAC) 20 MG capsule, , Disp: , Rfl:   .  LORazepam (ATIVAN) 1 MG tablet, Take 1 tablet (1 mg) by mouth once as needed for Anxiety for up to 1 dose. Take 2 hours before office procedure., Disp: 1 tablet, Rfl: 0    There is no immunization history on file for this patient.  Allergies   Allergen Reactions   . Penicillins Hives, Itching, Rash, Shortness of Breath and Swelling     Did it involve swelling of the face/tongue/throat, SOB, or low BP? No  Did it involve sudden or severe rash/hives, skin peeling, or any reaction on the inside of your  mouth or nose? Yes  Did you need to seek medical attention at a hospital or doctor's office? Yes  When did it last happen? About 2 years ago  If all above answers are "NO", may proceed with cephalosporin use.  Did it involve swelling of the face/tongue/throat, SOB, or low BP? No  Did it involve sudden or severe rash/hives, skin peeling, or any reaction on the inside of your mouth or nose? Yes  Did you need to seek medical attention at a hospital or doctor's office? Yes  When did it last happen? About 2 years ago  If all above answers are "NO", may proceed with cephalosporin use.  Did it involve swelling of the face/tongue/throat, SOB, or low BP? No  Did it involve sudden or severe rash/hives, skin peeling, or any reaction on the inside of your mouth or nose? Yes  Did you need to seek medical attention at a hospital or doctor's office? Yes  When did it last happen? About 2 years ago  If all above answers are "NO", may proceed with cephalosporin use.  Did it involve swelling of the face/tongue/throat, SOB, or low  BP? No  Did it involve sudden or severe rash/hives, skin peeling, or any reaction on the inside of your mouth or nose? Yes  Did you need to seek medical attention at a hospital or doctor's office? Yes  When did it last happen? About 2 years ago  If all above answers are "NO", may proceed with cephalosporin use.         Physical Exam: BP 119/81 (BP Location: Left arm, BP Patient Position: Sitting, BP cuff size: Large)   Pulse 80   Temp 98.3 F (36.8 C) (Temporal)   Wt 107.4 kg (236 lb 12.4 oz)   LMP 05/15/2021   General Appearance: healthy, alert, no distress, pleasant affect, cooperative.  Exam deferred at her request    Assessment/Plan:  Sonya Davenport is a G0P0000 with a recent ASCUS pap, non-typed HPV +. 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 She declined a repeat attempt at a colposcopy today. She is willing to attempt a colposcopy in the office after taking ativan before the procedure and doing a paracervical block. I prescribed 1 g ativan today to take before the procedure. She will have a driver. She will also take 500 mg alleve 1 hour before the procedure.     Consent for colposcopy signed today. Discussed risks including pain, bleeding, damage to surrounding structures, infection.     She also has a history of heavy menses and painful menses. +FHx fibroids. She is aware her risk of having fibroids is increased as she is [place] and her mother has a history of fibroids. I recommend a pelvic ultrasound. Advised on need for transvaginal imaging. She is amenable. She specifically declines mirena IUD insertion, although I did encourage it strongly given the benefit in heavy and painful menstrual bleeding.    Vapes, strongly encouraged cessation.     RTC for colposcopy    I spent a total of 30 minutes on this patient's care today. This was spent speaking with the patient and, in addition, reviewing the patient's recent chart, notes, labs, and/or  relevant images and coordinating her care with the team.     Alba Destine, MD  Obstetrics and Gynecology

## 2021-05-24 ENCOUNTER — Ambulatory Visit (INDEPENDENT_AMBULATORY_CARE_PROVIDER_SITE_OTHER): Payer: BLUE CROSS/BLUE SHIELD | Admitting: Student in an Organized Health Care Education/Training Program

## 2021-05-24 ENCOUNTER — Encounter (INDEPENDENT_AMBULATORY_CARE_PROVIDER_SITE_OTHER): Payer: Self-pay | Admitting: Student in an Organized Health Care Education/Training Program

## 2021-05-24 ENCOUNTER — Telehealth (INDEPENDENT_AMBULATORY_CARE_PROVIDER_SITE_OTHER): Payer: Self-pay | Admitting: Student in an Organized Health Care Education/Training Program

## 2021-05-24 VITALS — BP 119/81 | HR 80 | Temp 98.3°F | Wt 236.8 lb

## 2021-05-24 DIAGNOSIS — F419 Anxiety disorder, unspecified: Secondary | ICD-10-CM

## 2021-05-24 DIAGNOSIS — N946 Dysmenorrhea, unspecified: Secondary | ICD-10-CM

## 2021-05-24 DIAGNOSIS — Z87410 Personal history of cervical dysplasia: Secondary | ICD-10-CM

## 2021-05-24 DIAGNOSIS — N920 Excessive and frequent menstruation with regular cycle: Secondary | ICD-10-CM

## 2021-05-24 MED ORDER — FLUOXETINE HCL 20 MG OR CAPS
ORAL_CAPSULE | ORAL | Status: AC
Start: 2021-04-02 — End: ?

## 2021-05-24 MED ORDER — LORAZEPAM 1 MG OR TABS
1.0000 mg | ORAL_TABLET | Freq: Once | ORAL | 0 refills | Status: AC | PRN
Start: 2021-05-24 — End: ?

## 2021-05-24 MED ORDER — BUPROPION XL (DAILY) 300 MG OR TB24
ORAL_TABLET | ORAL | Status: AC
Start: 2021-04-22 — End: ?

## 2021-05-24 NOTE — Patient Instructions (Signed)
Please take ativan 2 hours before your colposcopy procedure.     Please schedule the colposcopy with Dr. Yetta Flock.     Please take alleve 500 mg one hour before the procedure.     Please make sure you have a driver.     Please call radiology to schedule your pelvic ultrasound.

## 2021-05-24 NOTE — Telephone Encounter (Signed)
I was able to get this patient in for your next procedure slot 10/2021 do you want her on sooner? If so what date and time?

## 2021-05-27 NOTE — Telephone Encounter (Signed)
Called patient and left her a message moving her appointment sooner than 10/2021 to 05/17 at 1130. I asked patient to call the office to confirm she got this message.

## 2021-06-05 ENCOUNTER — Other Ambulatory Visit
Payer: BLUE CROSS/BLUE SHIELD | Attending: Anatomic Pathology & Clinical Pathology | Admitting: Student in an Organized Health Care Education/Training Program

## 2021-06-05 ENCOUNTER — Encounter (INDEPENDENT_AMBULATORY_CARE_PROVIDER_SITE_OTHER): Payer: Self-pay | Admitting: Student in an Organized Health Care Education/Training Program

## 2021-06-05 VITALS — BP 124/90 | HR 91 | Temp 98.6°F | Wt 237.7 lb

## 2021-06-05 DIAGNOSIS — R87610 Atypical squamous cells of undetermined significance on cytologic smear of cervix (ASC-US): Secondary | ICD-10-CM | POA: Insufficient documentation

## 2021-06-05 DIAGNOSIS — Z3202 Encounter for pregnancy test, result negative: Secondary | ICD-10-CM

## 2021-06-05 DIAGNOSIS — R87810 Cervical high risk human papillomavirus (HPV) DNA test positive: Secondary | ICD-10-CM | POA: Insufficient documentation

## 2021-06-05 DIAGNOSIS — N871 Moderate cervical dysplasia: Secondary | ICD-10-CM

## 2021-06-05 DIAGNOSIS — Z87410 Personal history of cervical dysplasia: Secondary | ICD-10-CM

## 2021-06-05 LAB — URINE PREGNANCY TEST POCT: HCG, Urine: NEGATIVE

## 2021-06-05 MED ORDER — LIDOCAINE HCL 1 % IJ SOLN
10.0000 mL | Freq: Once | INTRAMUSCULAR | Status: AC
Start: 2021-06-05 — End: 2021-06-06

## 2021-06-05 NOTE — Patient Instructions (Addendum)
Scheduling Instructions    Please call [facility] Scheduling at [phone] or log in to your MyChart account to schedule your appointment at your earliest convenience. In order to obtain the most accurate insurance approval, we secure authorization after you are registered and scheduled for imaging services.  Please have your insurance cards available when scheduling your exam.      Colposcopy Aftercare Instructions    Our office will contact you with your results via phone or MyChart - this may take up to a week    What to expect afterwards  - You can resume normal daily activities after your colposcopy.  - You may have some light spotting or discoloration of your discharge for the first several days. You should not have new heavy bleeding after a colposcopy.   - You may have mild cramping or feel a little sore after your colposcopy. A hot water bottle or heating pad on low may be be helpful for cramps. If needed you can take an over-the-counter pain medicine, such as acetaminophen (Tylenol), ibuprofen (Advil, Motrin), and naproxen (Aleve) if needed. Read and follow all instructions on the label.     Activity restrictions  - Do not put anything in your vagina for 1 week if you had a biopsy done during your colposcopy. This includes tampons, sexual intercourse, or douching.  Please use pads for if you have any post-procedure bleeding.      Call us if you experience any of the following symptoms:    1.  You have fever higher than 100.5 F (38.0 C)  2.  You bleed or soak through 2 maxi pads per hour  3.  Severe cramping or persistent abdominal pain that is not controlled with over the counter pain medications   4.  You have yellow, green or foul smelling vaginal discharge    For more urgent matters during regular business hours, you can call the clinic where your procedure was performed.    For emergencies after hours or weekends, call [phone] [facility] operator) and ask to speak to the resident physician on  call for Gynecology.

## 2021-06-05 NOTE — Progress Notes (Signed)
Colposcopic Examination    Sonya Davenport is a 26 year old G0P0000 who presents today for colposcopic examination. Her wife drove her today as she took ativan 1 g 2 hours before the procedure due to severe anxiety regarding pelvic exams.     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   No excisional procedure.   No pap screening 2022.  Most recent pap outside [place] 04/2021 ASCUS, non-typed HPV +.   She did receive the gardasil vaccine series.    The risk, benefits and alternatives of colposcopy were discussed with the patient at her last visit on 5/5 including, but not limited to: risk of bleeding, pain, infection and damage to neighboring organs and tissues. Written consent obtained at last visit as she planned to take ativan today.    Patient's last menstrual period was 05/15/2021.  Method of birth control is same sex partner.   Pregnancy test Negative    Time Out: performed at 11:55 AM.  Correct patient:  yes  Correct procedure:  yes  Correct equipment and/or implants:  not applicable     PHYSICAL EXAMINATION    BP 124/90 (BP Location: Left arm, BP Patient Position: Sitting, BP cuff size: Large)   Pulse 91   Temp 98.6 F (37 C) (Temporal)   Wt 107.8 kg (237 lb 10.5 oz)   LMP 05/15/2021  There is no height or weight on file to calculate BMI.  GEN:       comfortable, alert, pleasant, oriented and in no distress  PELVIC:  Vulva: Normal external genitalia and Bartholin's glands, urethra, Skene's glands negative. VULVARCOLPOSCOPY: vulvar colposcopy not performed                  Vagina: no lesions or abnormal discharge. VAGINALCOLPOSCOPY: vaginal colposcopy not performed  Cervix: normal appearing cervix. CERVICALCOLPOSCOPY: 3% acetic acid applied, not able to see transformation zone, 10 mL 1% lidocaine with epi used in the cervical stroma around and in the external os for pain control, tenaculum applied to anterior cervix, endocervical curettage done and hemostasis achieved with silver nitrate    Assessment and plan: 26 year old  female with history of ASCUS pap, non-typed HPV + here for colposcopic examination  - colposcopic examination without visible TZ; ECC done  -I will contact with results. I can leave a detailed VM.     Jaquasia Doscher Bill Salinas, MD

## 2021-06-07 ENCOUNTER — Telehealth (INDEPENDENT_AMBULATORY_CARE_PROVIDER_SITE_OTHER): Payer: Self-pay

## 2021-06-07 ENCOUNTER — Telehealth (INDEPENDENT_AMBULATORY_CARE_PROVIDER_SITE_OTHER): Payer: Self-pay | Admitting: Student in an Organized Health Care Education/Training Program

## 2021-06-07 DIAGNOSIS — N949 Unspecified condition associated with female genital organs and menstrual cycle: Secondary | ICD-10-CM

## 2021-06-07 NOTE — Telephone Encounter (Signed)
Called and spoke with pt. Relayed POC per Dr. Elveria Rising and pt v/u. She asked if she could come in Monday instead. Scheduled her for 0830 per her request.  Provided pt with EC/Kennard precautions if sx worsen or new sx emerge over weekend. Pt v/u    Pended order for Dr. Elveria Rising to sign

## 2021-06-07 NOTE — Telephone Encounter (Signed)
-----   Message from Amy Bill Salinas, MD sent at 06/07/2021 12:43 PM PDT -----  Regarding: needs to come in to self collect a vaginosis swab today  This patient reported vaginal itching/burning to me over the phone when I reviewed her colposcopy results. She is amenable to collecting a self vaginosis PCR swab today. Can we please contact her with instructions and I can sign the swab when done. Thanks!

## 2021-06-07 NOTE — Telephone Encounter (Signed)
FINAL PATHOLOGIC DIAGNOSIS:   A: Endocervix, curettage   -Benign endocervical tissue.   COMMENT: This case was reviewed at gynecologic pathology consensus   conference on 06/07/2021.     Called to review pathology result. No CIN found. Previous h/o CIN 2. Most recent pap ASCUS, non-typed HPV positive.     Recommend follow up pap/HPV in 1 year.    She has some vaginal itching and irritation. She is on her menstrual cycle. She can come in for a self swab today. Triage nurses notified[place]    [place]  PID [id number]  Obstetrics and Gynecology

## 2021-06-10 ENCOUNTER — Telehealth (INDEPENDENT_AMBULATORY_CARE_PROVIDER_SITE_OTHER): Payer: Self-pay

## 2021-06-10 ENCOUNTER — Encounter (INDEPENDENT_AMBULATORY_CARE_PROVIDER_SITE_OTHER): Payer: BLUE CROSS/BLUE SHIELD

## 2021-06-10 NOTE — Telephone Encounter (Signed)
Pt did not show for her 0830 nurse visit self vaginitis swab. Called pt and left vm to confirm if she still would like an appt.

## 2021-06-18 ENCOUNTER — Ambulatory Visit
Admission: RE | Admit: 2021-06-18 | Discharge: 2021-06-18 | Disposition: A | Discharge status: Home / Self Care | Payer: BLUE CROSS/BLUE SHIELD | Attending: Student in an Organized Health Care Education/Training Program | Admitting: Student in an Organized Health Care Education/Training Program

## 2021-06-18 DIAGNOSIS — N920 Excessive and frequent menstruation with regular cycle: Secondary | ICD-10-CM | POA: Insufficient documentation

## 2021-06-18 DIAGNOSIS — N946 Dysmenorrhea, unspecified: Secondary | ICD-10-CM | POA: Insufficient documentation

## 2021-06-19 ENCOUNTER — Telehealth (INDEPENDENT_AMBULATORY_CARE_PROVIDER_SITE_OTHER): Payer: Self-pay

## 2021-06-19 NOTE — Telephone Encounter (Signed)
Telephone call to patient. Verified identity with name & DOB. Informed: Per Dr. Yetta Flock, 06/18/21 pelvic ultrasound was normal. No fibroids are seen. Pt v/u and thankful for the information.    Mychart is active.

## 2021-06-19 NOTE — Telephone Encounter (Signed)
Pt is returning missed nurse call. Pt is requesting a call back after 1pm. Please Advise. Thank You

## 2021-06-19 NOTE — Telephone Encounter (Signed)
Left message for pt to return call to clinic

## 2021-06-19 NOTE — Telephone Encounter (Signed)
Driebe, Amy Toney Sang, MD  [facility] Triage  Please notify Sonya Davenport that her pelvic ultrasound is normal. No fibroids are seen. Please encourage her to sign up for mychart.

## 2021-09-25 ENCOUNTER — Ambulatory Visit (HOSPITAL_BASED_OUTPATIENT_CLINIC_OR_DEPARTMENT_OTHER): Payer: BLUE CROSS/BLUE SHIELD | Admitting: Registered"

## 2021-11-01 ENCOUNTER — Ambulatory Visit (INDEPENDENT_AMBULATORY_CARE_PROVIDER_SITE_OTHER): Payer: Medicaid Other | Admitting: Student in an Organized Health Care Education/Training Program
